# Patient Record
Sex: Female | Born: 1985 | Hispanic: No | Marital: Single
Health system: Southern US, Community
[De-identification: ages and names within clinical notes are randomized; demographics above are authoritative.]

---

## 2003-03-12 ENCOUNTER — Other Ambulatory Visit: Admission: RE | Admit: 2003-03-12 | Discharge: 2003-03-12 | Payer: Self-pay | Admitting: Obstetrics and Gynecology

## 2003-05-26 ENCOUNTER — Inpatient Hospital Stay (HOSPITAL_COMMUNITY): Admission: AD | Admit: 2003-05-26 | Discharge: 2003-05-28 | Payer: Self-pay | Admitting: *Deleted

## 2006-01-27 ENCOUNTER — Inpatient Hospital Stay (HOSPITAL_COMMUNITY): Admission: EM | Admit: 2006-01-27 | Discharge: 2006-01-31 | Payer: Self-pay | Admitting: Emergency Medicine

## 2007-07-19 ENCOUNTER — Ambulatory Visit (HOSPITAL_COMMUNITY): Admission: RE | Admit: 2007-07-19 | Discharge: 2007-07-19 | Payer: Self-pay | Admitting: Family Medicine

## 2007-08-08 ENCOUNTER — Ambulatory Visit: Payer: Self-pay | Admitting: Obstetrics and Gynecology

## 2007-08-08 ENCOUNTER — Inpatient Hospital Stay (HOSPITAL_COMMUNITY): Admission: AD | Admit: 2007-08-08 | Discharge: 2007-08-08 | Payer: Self-pay | Admitting: Gynecology

## 2007-08-15 ENCOUNTER — Ambulatory Visit (HOSPITAL_COMMUNITY): Admission: RE | Admit: 2007-08-15 | Discharge: 2007-08-15 | Payer: Self-pay | Admitting: Obstetrics & Gynecology

## 2007-08-19 ENCOUNTER — Inpatient Hospital Stay (HOSPITAL_COMMUNITY): Admission: AD | Admit: 2007-08-19 | Discharge: 2007-08-21 | Payer: Self-pay | Admitting: Obstetrics & Gynecology

## 2007-08-19 ENCOUNTER — Ambulatory Visit: Payer: Self-pay | Admitting: Obstetrics and Gynecology

## 2011-06-20 LAB — COMPREHENSIVE METABOLIC PANEL
ALT: 35
AST: 34
Albumin: 2.6 — ABNORMAL LOW
Alkaline Phosphatase: 143 — ABNORMAL HIGH
Calcium: 8.7
Creatinine, Ser: 0.65
GFR calc Af Amer: >60
GFR calc non Af Amer: >60
Glucose, Bld: 80
Potassium: 3.3 — ABNORMAL LOW
Total Bilirubin: 0.4

## 2011-06-20 LAB — URINALYSIS, ROUTINE W REFLEX MICROSCOPIC
Bilirubin Urine: NEGATIVE
Glucose, UA: NEGATIVE
Ketones, ur: NEGATIVE
Nitrite: NEGATIVE
Specific Gravity, Urine: 1.01
pH: 6.5

## 2011-06-20 LAB — RH IMMUNE GLOB WKUP(>/=20WKS)(NOT WOMEN'S HOSP)

## 2011-06-20 LAB — RAPID HIV SCREEN (WH-MAU): Rapid HIV Screen: NONREACTIVE

## 2011-06-20 LAB — ANTIBODY SCREEN: Antibody Screen: POSITIVE

## 2011-06-20 LAB — CBC
Hemoglobin: 11.2 — ABNORMAL LOW
Platelets: 221
RBC: 4.18
RDW: 14.6
WBC: 11.2 — ABNORMAL HIGH

## 2011-06-20 LAB — URINE CULTURE: Culture: NO GROWTH

## 2011-06-20 LAB — URINE MICROSCOPIC-ADD ON

## 2011-06-21 LAB — URINE CULTURE

## 2011-06-21 LAB — URINE MICROSCOPIC-ADD ON

## 2011-06-21 LAB — URINALYSIS, ROUTINE W REFLEX MICROSCOPIC
Bilirubin Urine: NEGATIVE
Glucose, UA: NEGATIVE
Nitrite: NEGATIVE
Protein, ur: NEGATIVE
Specific Gravity, Urine: <1.005 — ABNORMAL LOW
Urobilinogen, UA: 0.2
pH: 5.5

## 2018-10-18 ENCOUNTER — Emergency Department (HOSPITAL_COMMUNITY)
Admission: EM | Admit: 2018-10-18 | Discharge: 2018-10-18 | Disposition: A | Discharge status: Home / Self Care | Payer: No Typology Code available for payment source | Attending: Emergency Medicine | Admitting: Emergency Medicine

## 2018-10-18 ENCOUNTER — Encounter (HOSPITAL_COMMUNITY): Payer: Self-pay | Admitting: Emergency Medicine

## 2018-10-18 ENCOUNTER — Emergency Department (HOSPITAL_COMMUNITY): Payer: No Typology Code available for payment source

## 2018-10-18 DIAGNOSIS — M25512 Pain in left shoulder: Secondary | ICD-10-CM | POA: Diagnosis not present

## 2018-10-18 DIAGNOSIS — M542 Cervicalgia: Secondary | ICD-10-CM | POA: Insufficient documentation

## 2018-10-18 MED ORDER — METHOCARBAMOL 500 MG PO TABS: 500.0000 mg | ORAL_TABLET | Freq: Two times a day (BID) | ORAL | 0 refills | Status: AC

## 2018-10-18 MED ORDER — NAPROXEN 375 MG PO TABS: 375.0000 mg | ORAL_TABLET | Freq: Two times a day (BID) | ORAL | 0 refills | Status: AC

## 2018-10-18 MED ORDER — DIAZEPAM 2 MG PO TABS
2.0000 mg | ORAL_TABLET | Freq: Once | ORAL | Status: AC
  Administered 2018-10-18: 2 mg via ORAL
  Filled 2018-10-18: qty 1
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
## 2018-10-18 NOTE — ED Notes (Signed)
Pt verbalized understanding of discharge paperwork, follow-up care and prescriptions.  

## 2018-10-18 NOTE — ED Notes (Signed)
Interpreter used; interp. ID [id number]
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
## 2018-10-18 NOTE — Discharge Instructions (Signed)
Le he recetado medicina para que ayude con sus simptomas, por favor tome 1 tableta dos veces al dia para ayudar con sus simptomas. Si tiene nausea, dolor de cabeza o sus simptomas empeoran por favor regrese a Radio broadcast assistant.

## 2018-10-18 NOTE — ED Notes (Signed)
Patient transported to X-ray 
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
## 2019-08-13 ENCOUNTER — Other Ambulatory Visit: Payer: Self-pay

## 2019-08-13 DIAGNOSIS — Z20822 Contact with and (suspected) exposure to covid-19: Secondary | ICD-10-CM

## 2019-08-16 LAB — NOVEL CORONAVIRUS, NAA: SARS-CoV-2, NAA: DETECTED — AB
